# Patient Record
Sex: Female | Born: 1965 | Race: White | Hispanic: No | Marital: Married
Health system: Southern US, Community
[De-identification: ages and names within clinical notes are randomized; demographics above are authoritative.]

## PROBLEM LIST (undated history)

## (undated) DIAGNOSIS — S42301A Unspecified fracture of shaft of humerus, right arm, initial encounter for closed fracture: Secondary | ICD-10-CM

## (undated) DIAGNOSIS — E559 Vitamin D deficiency, unspecified: Secondary | ICD-10-CM

## (undated) DIAGNOSIS — M545 Low back pain, unspecified: Secondary | ICD-10-CM

## (undated) HISTORY — DX: Unspecified fracture of shaft of humerus, right arm, initial encounter for closed fracture: S42.301A

## (undated) HISTORY — PX: CHOLECYSTECTOMY: SHX55

## (undated) HISTORY — DX: Vitamin D deficiency, unspecified: E55.9

## (undated) HISTORY — DX: Low back pain: M54.5

## (undated) HISTORY — PX: OOPHORECTOMY: SHX86

## (undated) HISTORY — DX: Low back pain, unspecified: M54.50

---

## 1999-06-17 ENCOUNTER — Emergency Department (HOSPITAL_COMMUNITY): Admission: EM | Admit: 1999-06-17 | Discharge: 1999-06-17 | Payer: Self-pay | Admitting: *Deleted

## 1999-06-19 ENCOUNTER — Ambulatory Visit (HOSPITAL_COMMUNITY): Admission: RE | Admit: 1999-06-19 | Discharge: 1999-06-19 | Payer: Self-pay | Admitting: Urology

## 1999-06-19 ENCOUNTER — Encounter: Payer: Self-pay | Admitting: Urology

## 1999-11-26 DIAGNOSIS — S42301A Unspecified fracture of shaft of humerus, right arm, initial encounter for closed fracture: Secondary | ICD-10-CM

## 1999-11-26 HISTORY — DX: Unspecified fracture of shaft of humerus, right arm, initial encounter for closed fracture: S42.301A

## 2000-09-08 ENCOUNTER — Encounter: Payer: Self-pay | Admitting: Specialist

## 2000-09-08 ENCOUNTER — Encounter: Admission: RE | Admit: 2000-09-08 | Discharge: 2000-09-08 | Payer: Self-pay | Admitting: Specialist

## 2008-03-03 ENCOUNTER — Emergency Department (HOSPITAL_COMMUNITY): Admission: EM | Admit: 2008-03-03 | Discharge: 2008-03-03 | Payer: Self-pay | Admitting: Emergency Medicine

## 2008-03-03 IMAGING — CR DG CERVICAL SPINE COMPLETE 4+V
5 series · 5 of 5 positions shown · non-contrast
Comparison: None

CLINICAL DATA: Trauma, neck pain

CERVICAL SPINE - COMPLETE 4+ VIEW

[w c-spine lat *]
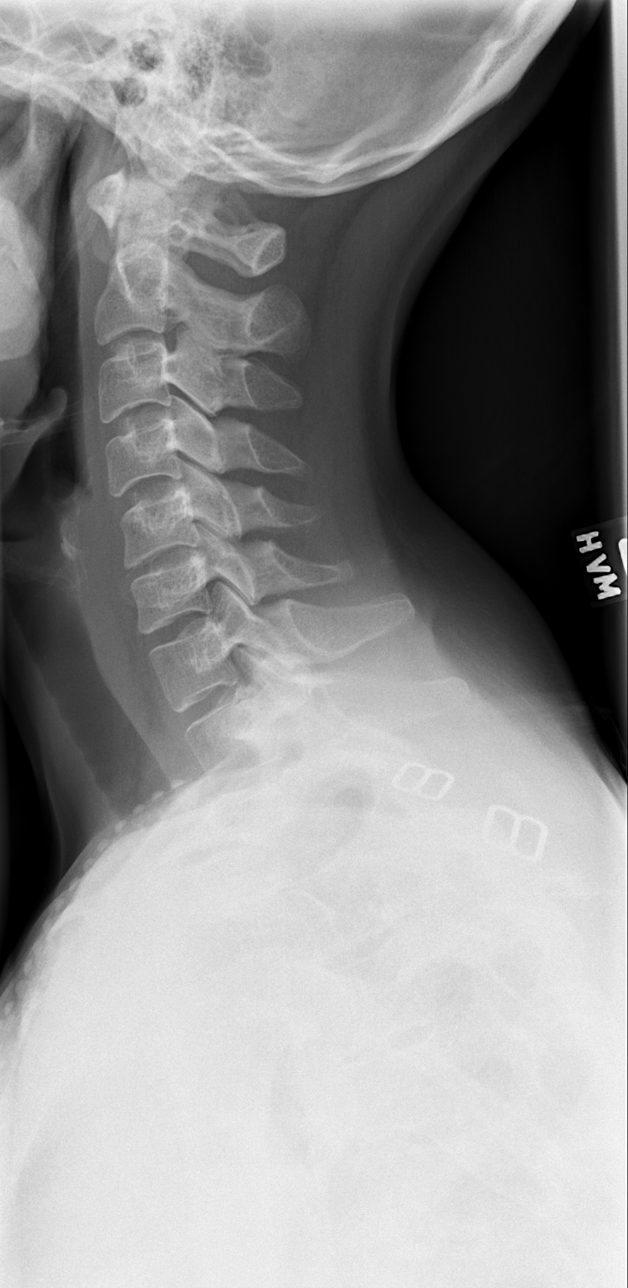

[w c-spine oblique * (1 of 2)]
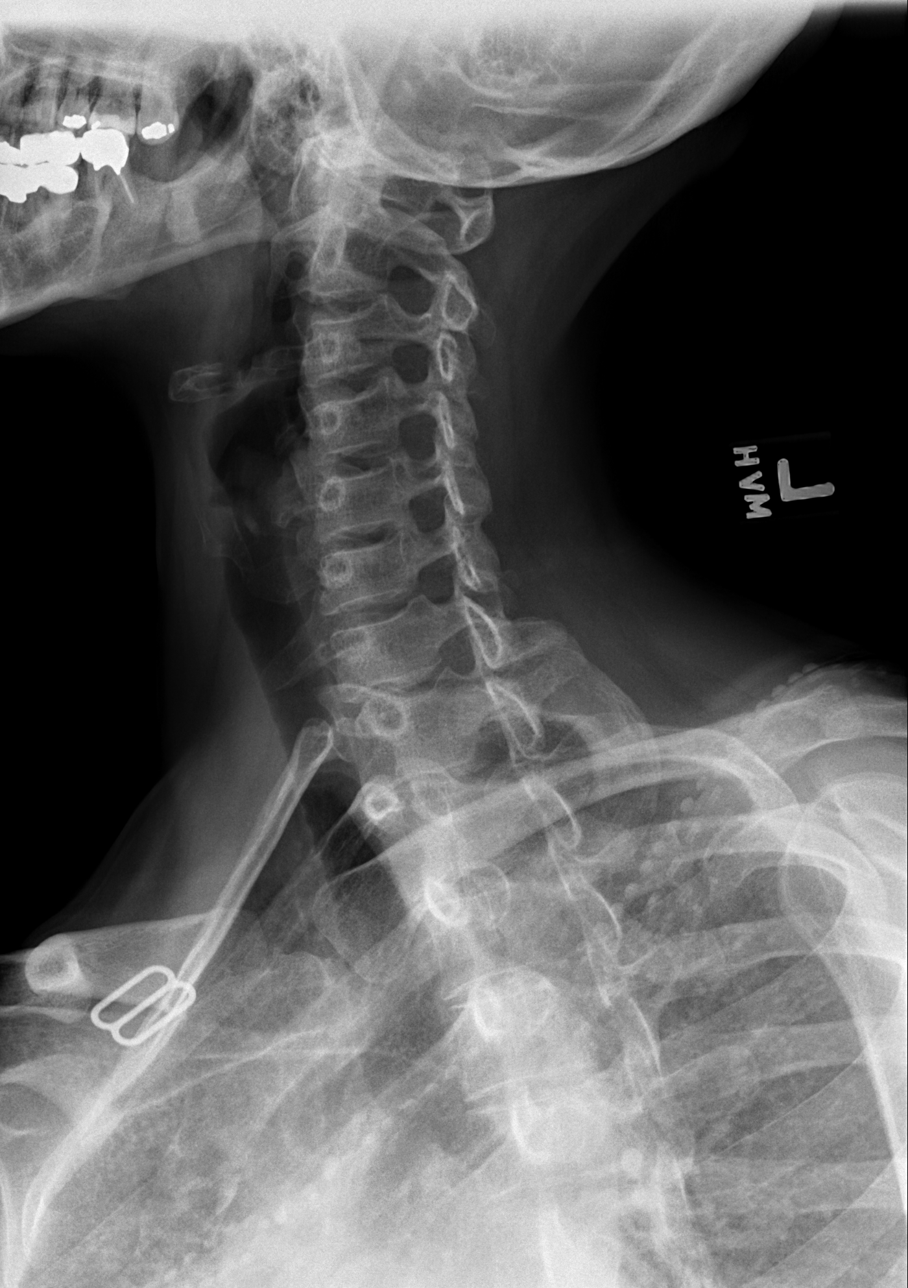

[w c-spine oblique * (2 of 2)]
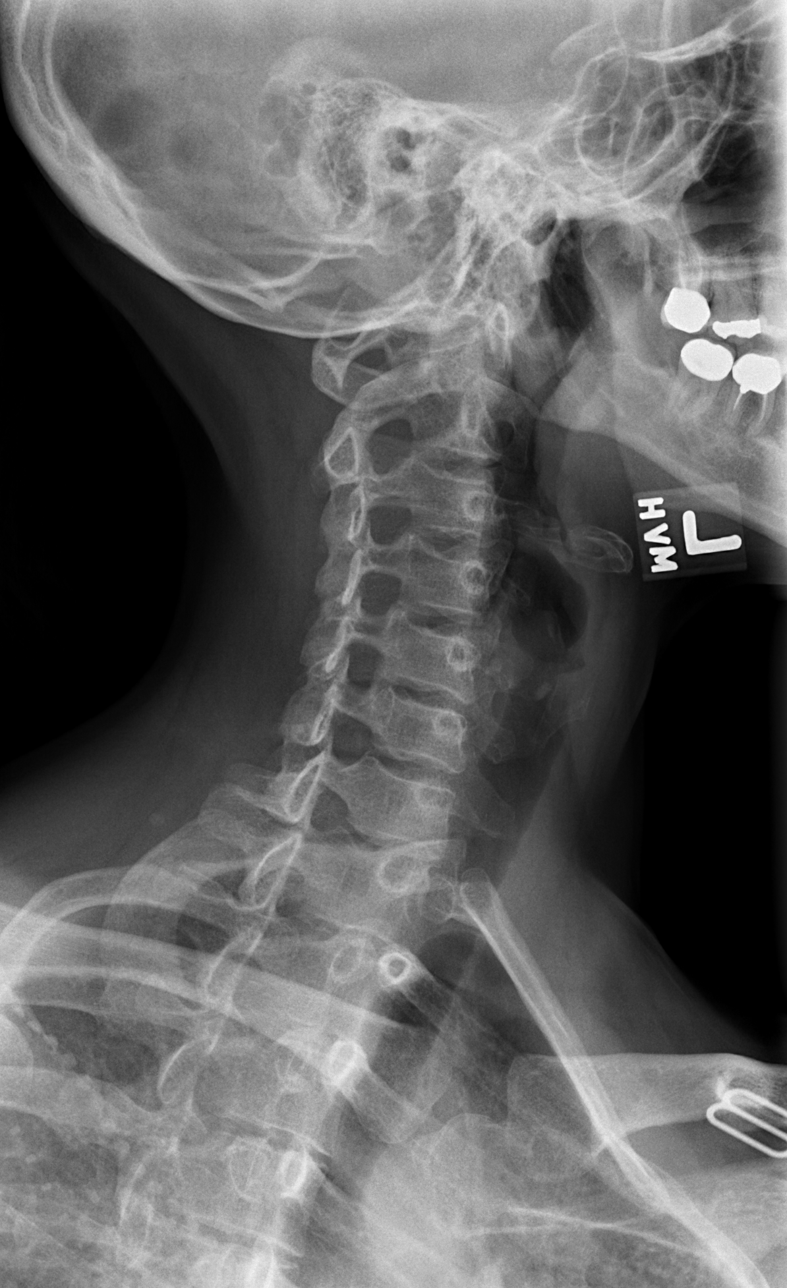

[w c-spine a.p. *]
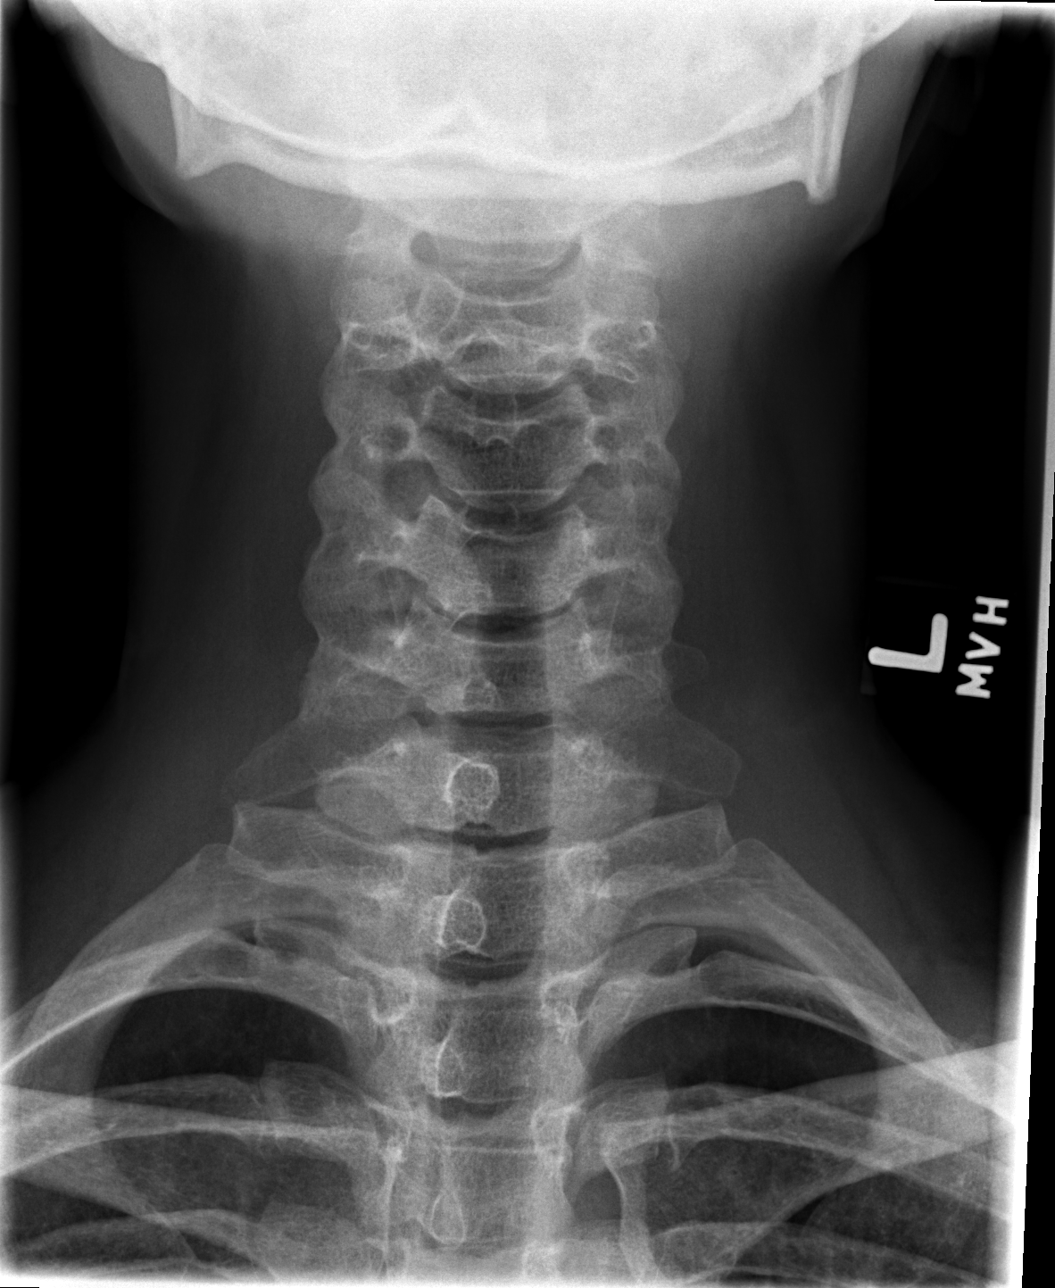

[w c-spine odontoid *]
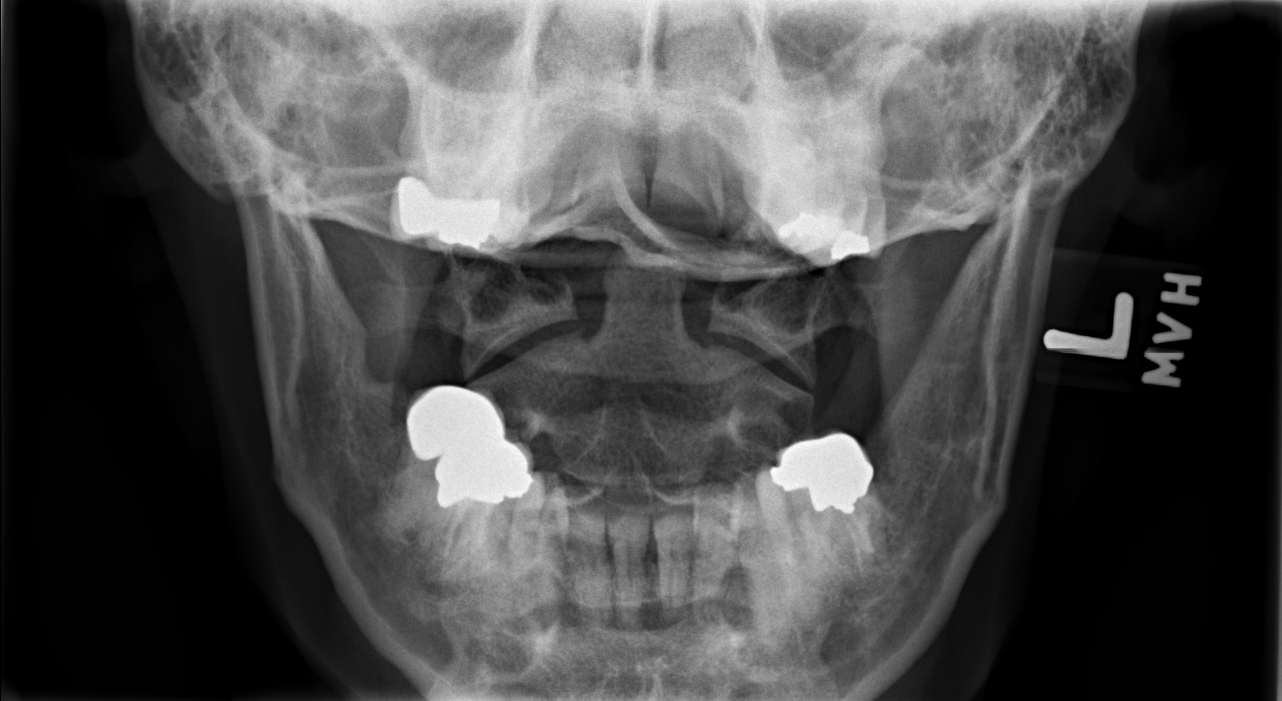

[5 of 5 positions shown; findings below may reference images not displayed]

FINDINGS: The alignment of the cervical spine is anatomic.  The
prevertebral soft tissues are normal.  No fracture is identified.
Cervical thoracic junction is intact
IMPRESSION: Negative for fracture, subluxation or static signs of instability.

## 2016-05-13 ENCOUNTER — Other Ambulatory Visit: Payer: Self-pay | Admitting: Obstetrics and Gynecology

## 2016-05-13 DIAGNOSIS — R928 Other abnormal and inconclusive findings on diagnostic imaging of breast: Secondary | ICD-10-CM

## 2016-05-20 ENCOUNTER — Ambulatory Visit
Admission: RE | Admit: 2016-05-20 | Discharge: 2016-05-20 | Disposition: A | Discharge status: Home / Self Care | Payer: BLUE CROSS/BLUE SHIELD | Source: Ambulatory Visit | Attending: Obstetrics and Gynecology | Admitting: Obstetrics and Gynecology

## 2016-05-20 DIAGNOSIS — R928 Other abnormal and inconclusive findings on diagnostic imaging of breast: Secondary | ICD-10-CM

## 2018-05-18 ENCOUNTER — Ambulatory Visit: Payer: BLUE CROSS/BLUE SHIELD | Admitting: Diagnostic Neuroimaging

## 2018-05-18 ENCOUNTER — Encounter: Payer: Self-pay | Admitting: Diagnostic Neuroimaging

## 2018-05-18 ENCOUNTER — Encounter: Payer: Self-pay | Admitting: *Deleted

## 2018-05-18 ENCOUNTER — Telehealth: Payer: Self-pay | Admitting: *Deleted

## 2018-05-18 NOTE — Telephone Encounter (Signed)
Patient called this morning and cancelled her new patient appointment for 10 am today due to tooth abscess.

## 2018-07-16 ENCOUNTER — Telehealth: Payer: Self-pay | Admitting: *Deleted

## 2018-07-16 NOTE — Telephone Encounter (Signed)
LMVM for pt on her mobile, about her appt scheduled for tomorrow.  Looking at it referral, LBP and spinal injections?  Asked her to call relating to questions concerning her appt.  Her work [employer]   Pt returned call and stated due to her traveling relating to her grandbaby (has cancer (retinoblastoma) in [place] she has to cancel tomorrow's appt and then reschedule sometime in sept or oct.  She will call to do this.  She is aware that it is consult and not for us to give injections.  She wants to find out what is causing back pain and hip pain.

## 2018-07-17 ENCOUNTER — Ambulatory Visit: Payer: BLUE CROSS/BLUE SHIELD | Admitting: Diagnostic Neuroimaging

## 2019-04-20 ENCOUNTER — Other Ambulatory Visit: Payer: Self-pay | Admitting: Rehabilitation

## 2019-04-20 DIAGNOSIS — M5416 Radiculopathy, lumbar region: Secondary | ICD-10-CM

## 2019-04-21 ENCOUNTER — Ambulatory Visit
Admission: RE | Admit: 2019-04-21 | Discharge: 2019-04-21 | Disposition: A | Discharge status: Home / Self Care | Payer: BLUE CROSS/BLUE SHIELD | Source: Ambulatory Visit | Attending: Rehabilitation | Admitting: Rehabilitation

## 2019-04-21 ENCOUNTER — Other Ambulatory Visit: Payer: Self-pay

## 2019-04-21 DIAGNOSIS — M5416 Radiculopathy, lumbar region: Secondary | ICD-10-CM

## 2019-04-21 IMAGING — MR MRI LUMBAR SPINE WITHOUT CONTRAST
4 of 5 series · 26 of 48 positions shown · non-contrast
Comparison: MRI lumbar spine dated [DATE].

CLINICAL DATA: Chronic right-sided low back pain radiating into the
right leg.

EXAM:
MRI LUMBAR SPINE WITHOUT CONTRAST
TECHNIQUE: Multiplanar, multisequence MR imaging of the lumbar spine was
performed. No intravenous contrast was administered.

[Series 3: T2 · sagittal · 4.0mm · 0.55mm/px · 6 of 14 slices shown (1 of 2)]
[im 1/14]
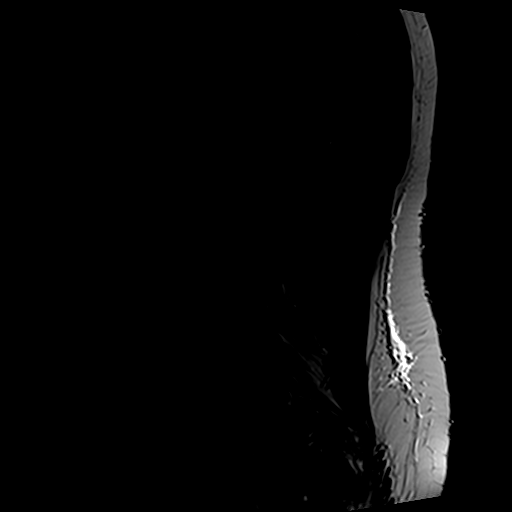
[im 3/14]
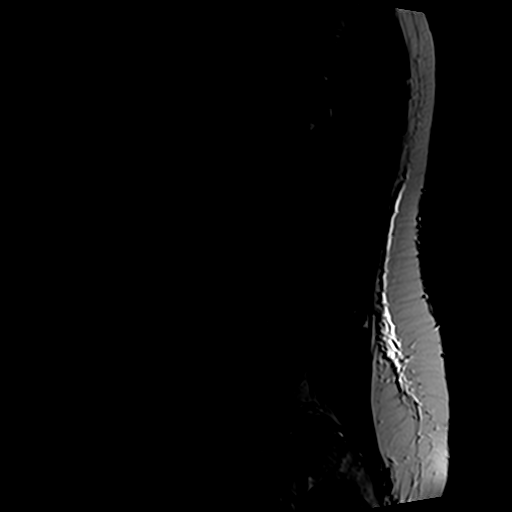
[im 6/14]
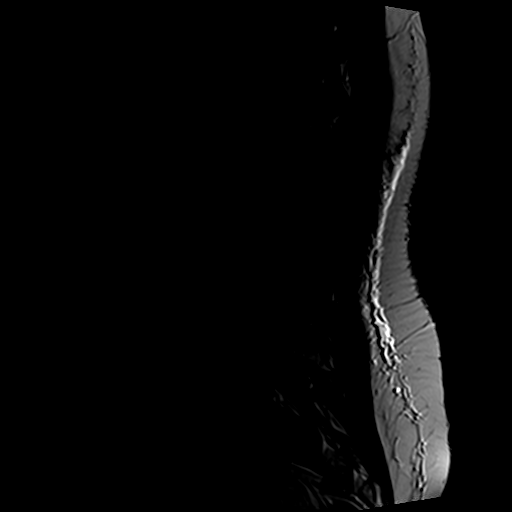
[im 8/14]
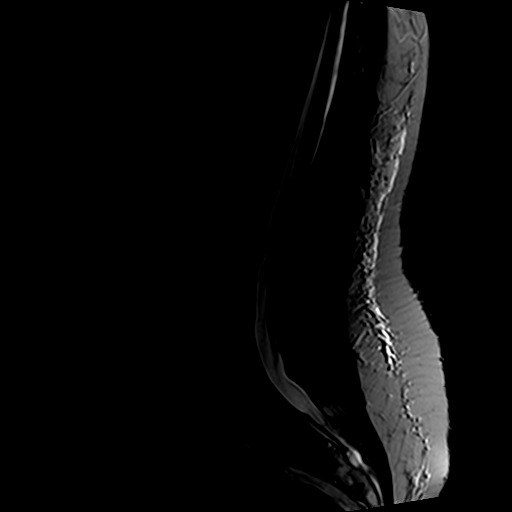
[im 11/14]
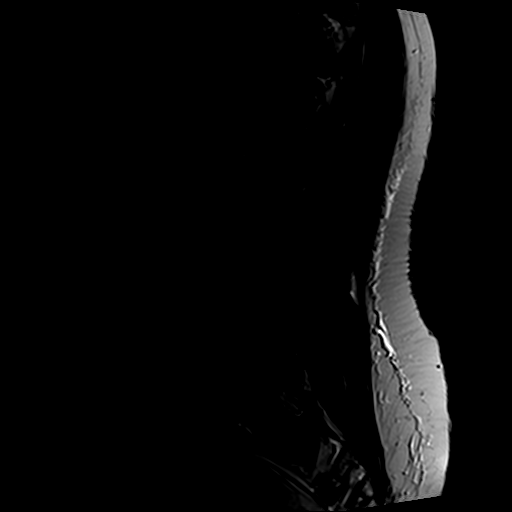
[im 14/14]
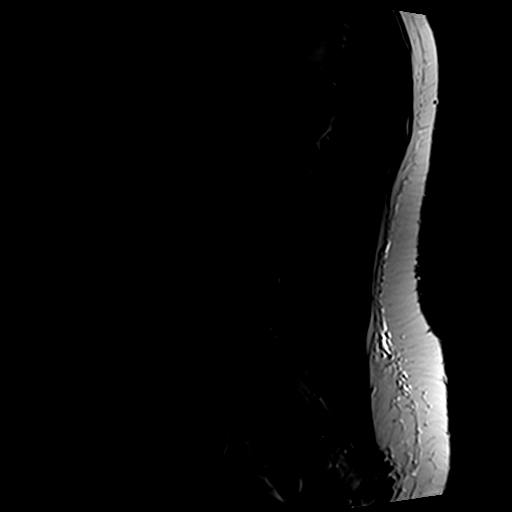

[Series 4: T1 · sagittal · 4.0mm · 0.55mm/px · 6 of 14 slices shown (1 of 2)]
[im 1/14]
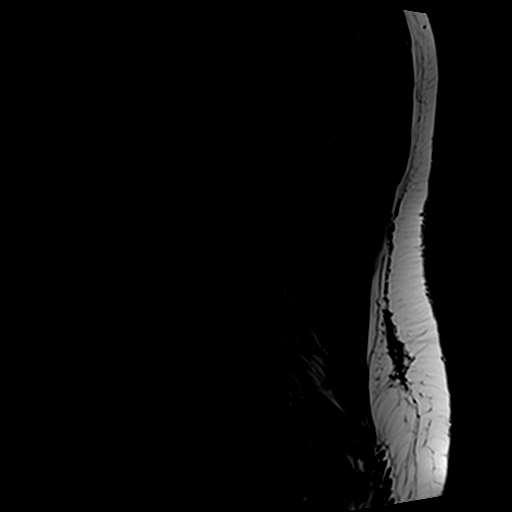
[im 3/14]
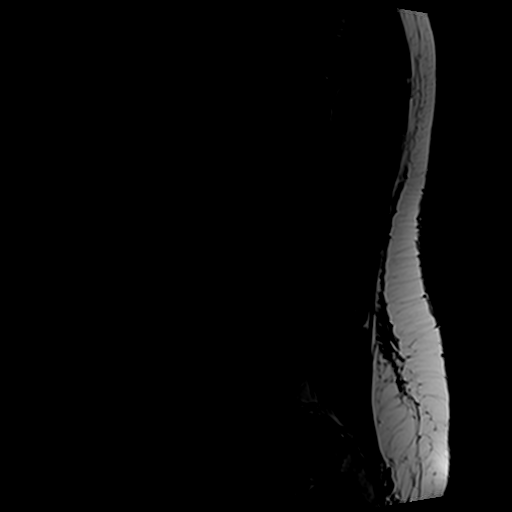
[im 6/14]
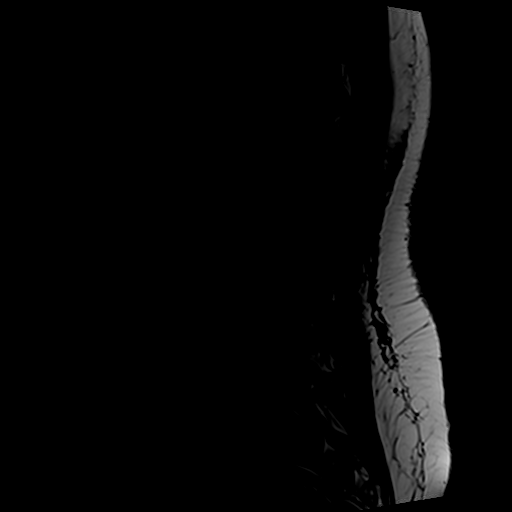
[im 8/14]
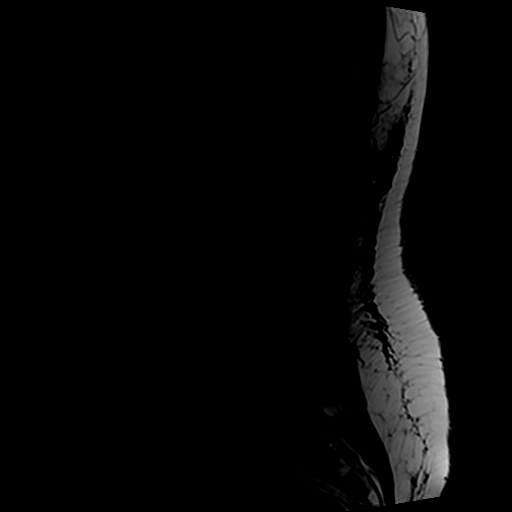
[im 11/14]
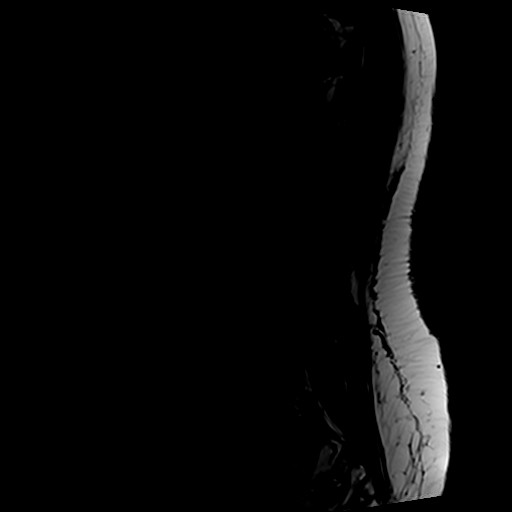
[im 14/14]
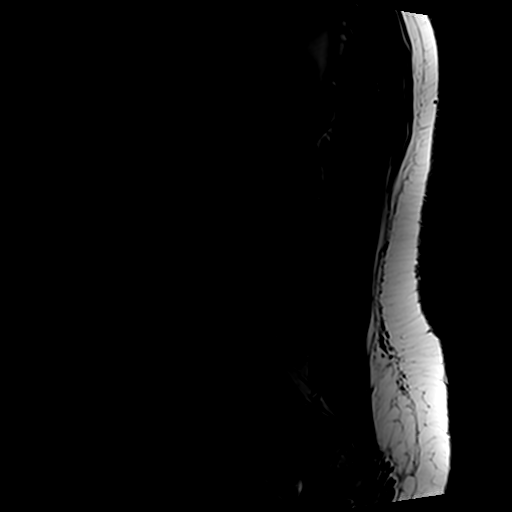

[Series 6: T2 · axial · 4.0mm · 0.70mm/px · z∈[-93,+109]mm · 9 of 36 slices shown (2 of 2)]
[im 1/36]
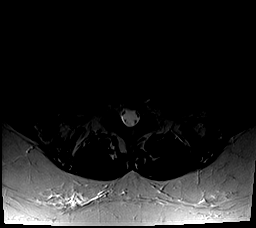
[im 6/36]
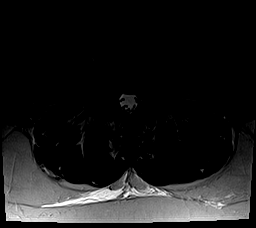
[im 11/36]
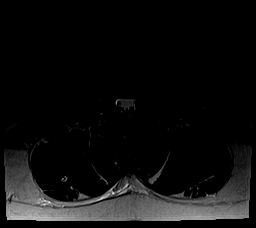
[im 16/36]
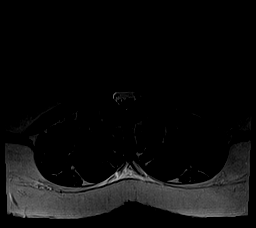
[im 18/36]
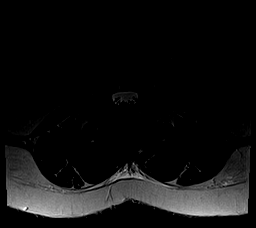
[im 21/36]
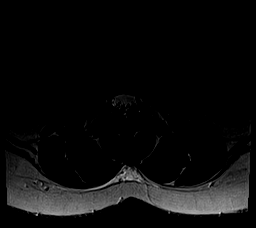
[im 26/36]
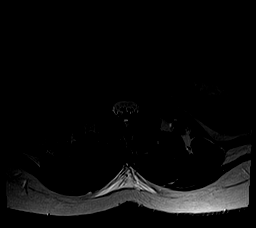
[im 31/36]
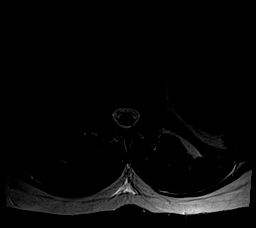
[im 36/36]
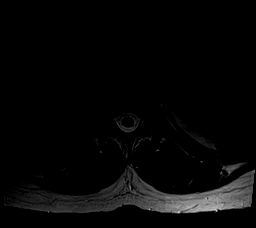

[Series 7: T1 · axial · 4.0mm · 0.35mm/px · z∈[-93,+84]mm · 5 of 36 slices shown (2 of 2)]
[im 1/36]
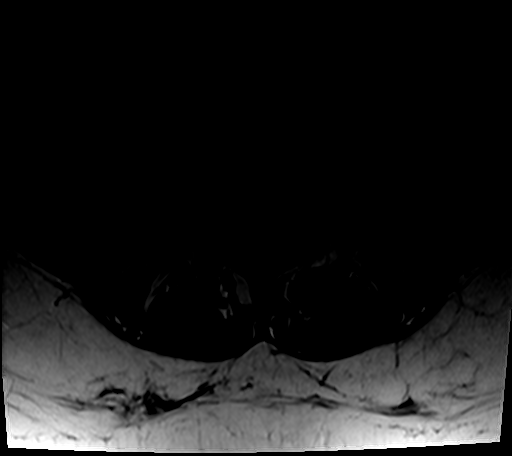
[im 6/36]
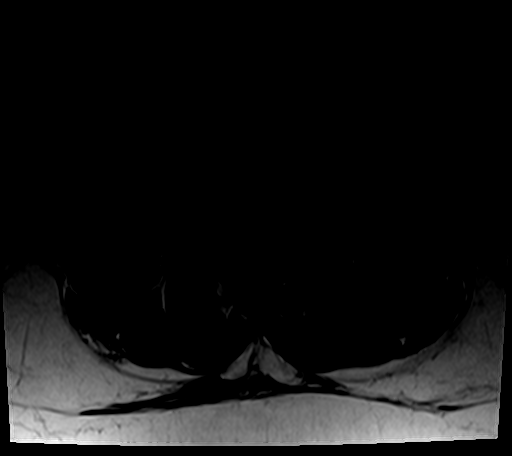
[im 11/36]
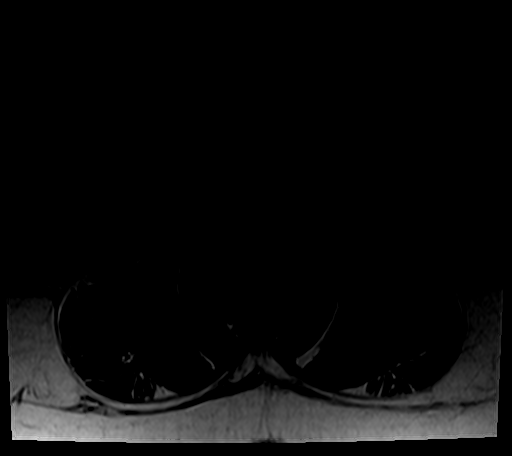
[im 18/36]
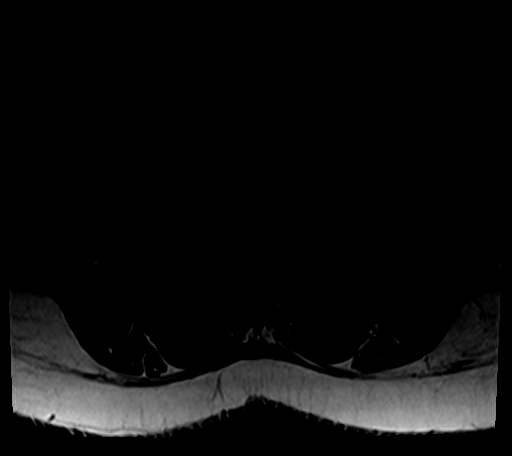
[im 31/36]
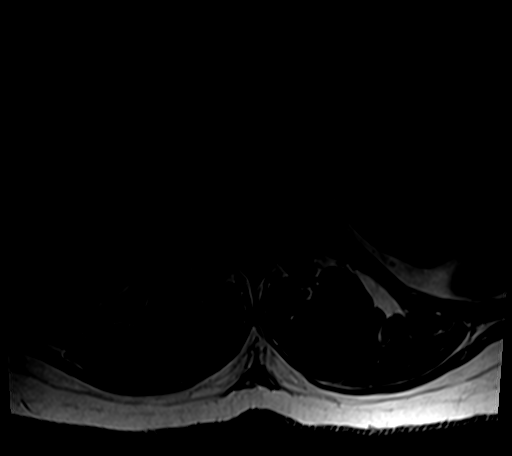

[26 of 48 positions shown; findings below may reference images not displayed]

FINDINGS: Segmentation: Unchanged partial lumbarization of S1 with rudimentary
S1-S2 disc space.

Alignment:  Physiologic.

Vertebrae:  No fracture, evidence of discitis, or bone lesion.

Conus medullaris and cauda equina: Conus extends to the L2 level.
Conus and cauda equina appear normal.

Paraspinal and other soft tissues: Negative.

Disc levels:

T12-L1:  Negative.

L1-L2:  Negative.

L2-L3:  Negative.

L3-L4: Unchanged mild disc bulging and small left far lateral disc
protrusion encroaching on the exiting left L3 nerve root. No
stenosis.

L4-L5: Unchanged minimal disc bulging encroaching on the
extraforaminal left L4 nerve root. No stenosis.

L5-S1: Unchanged tiny shallow broad-based posterior disc protrusion.
No stenosis.
IMPRESSION: 1. Unchanged mild lower lumbar spondylosis as described above, with
disc material encroaching on the exiting left L3 and L4 nerve roots.
No stenosis or impingement.

## 2020-04-12 ENCOUNTER — Other Ambulatory Visit: Payer: Self-pay | Admitting: Rehabilitation

## 2020-04-12 DIAGNOSIS — M47816 Spondylosis without myelopathy or radiculopathy, lumbar region: Secondary | ICD-10-CM

## 2020-06-06 ENCOUNTER — Ambulatory Visit
Admission: RE | Admit: 2020-06-06 | Discharge: 2020-06-06 | Disposition: A | Discharge status: Home / Self Care | Payer: BLUE CROSS/BLUE SHIELD | Source: Ambulatory Visit | Attending: Rehabilitation | Admitting: Rehabilitation

## 2020-06-06 ENCOUNTER — Other Ambulatory Visit: Payer: Self-pay

## 2020-06-06 DIAGNOSIS — M47816 Spondylosis without myelopathy or radiculopathy, lumbar region: Secondary | ICD-10-CM

## 2020-06-06 IMAGING — MR MR LUMBAR SPINE W/O CM
4 of 5 series · 18 of 48 positions shown · non-contrast
Comparison: [DATE] MRI lumbar spine.

CLINICAL DATA: Low back pain.

EXAM:
MRI LUMBAR SPINE WITHOUT CONTRAST
TECHNIQUE: Multiplanar, multisequence MR imaging of the lumbar spine was
performed. No intravenous contrast was administered.

[Series 5: T2 · sagittal · 4.0mm · 0.73mm/px · 6 of 15 slices shown (1 of 2)]
[im 1/15]
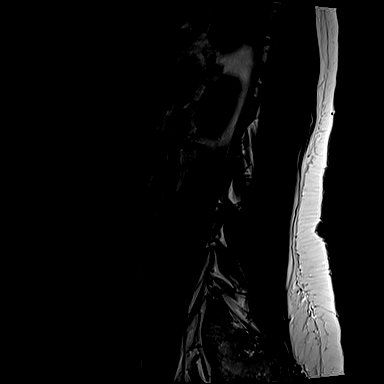
[im 3/15]
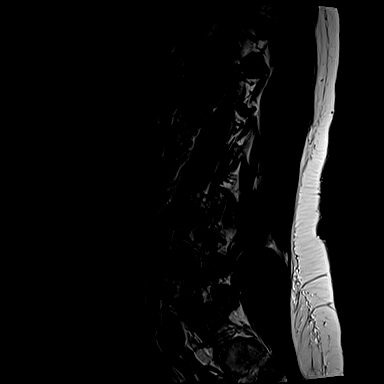
[im 6/15]
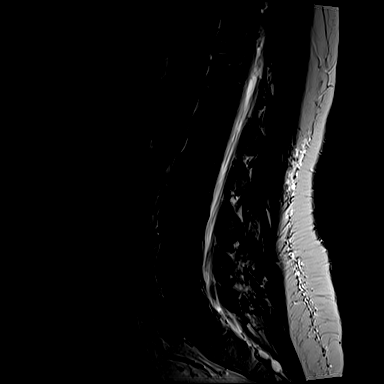
[im 9/15]
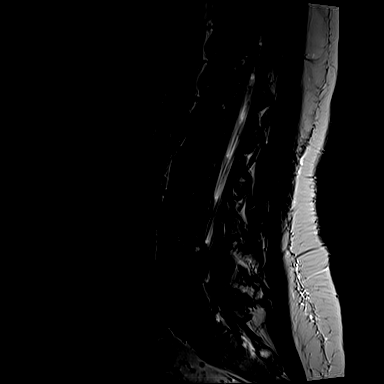
[im 12/15]
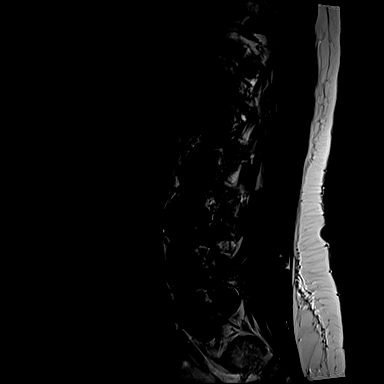
[im 15/15]
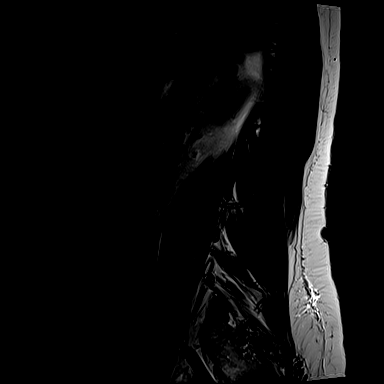

[Series 6: T1 · sagittal · 4.0mm · 0.73mm/px · 3 of 15 slices shown (1 of 2)]
[im 3/15]
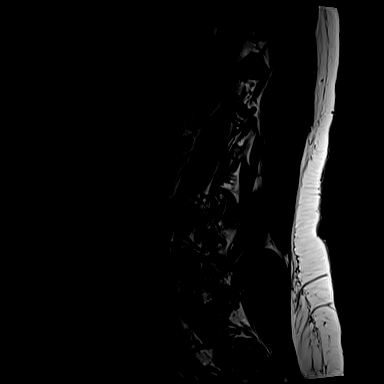
[im 9/15]
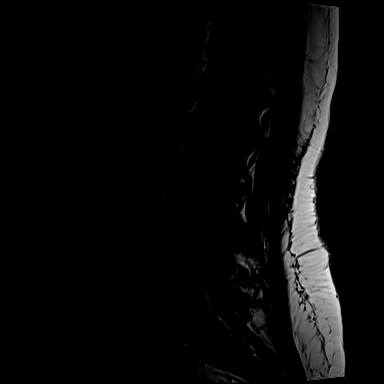
[im 15/15]
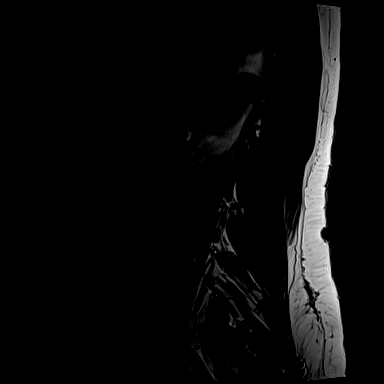

[Series 10: T1 · axial · 4.0mm · 0.28mm/px · z∈[+54,+214]mm · 3 of 40 slices shown (2 of 2)]
[im 6/40]
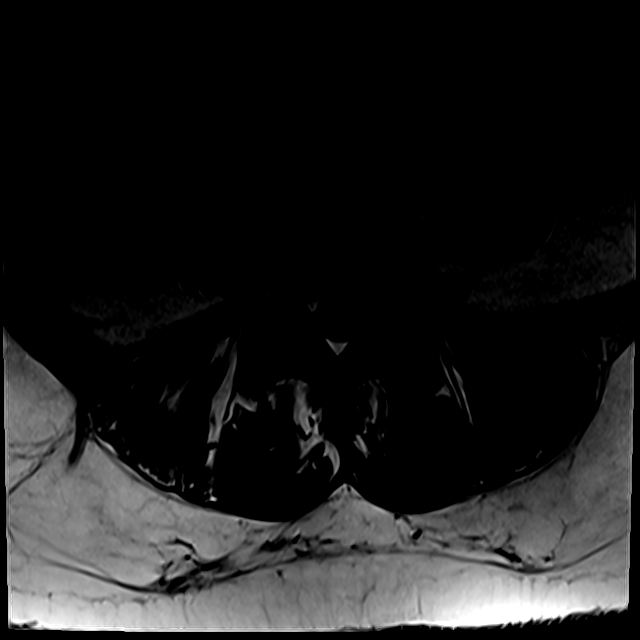
[im 20/40]
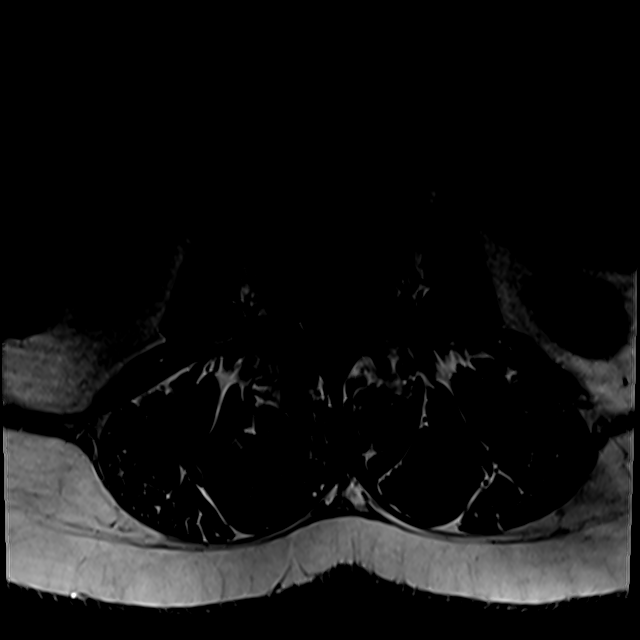
[im 34/40]
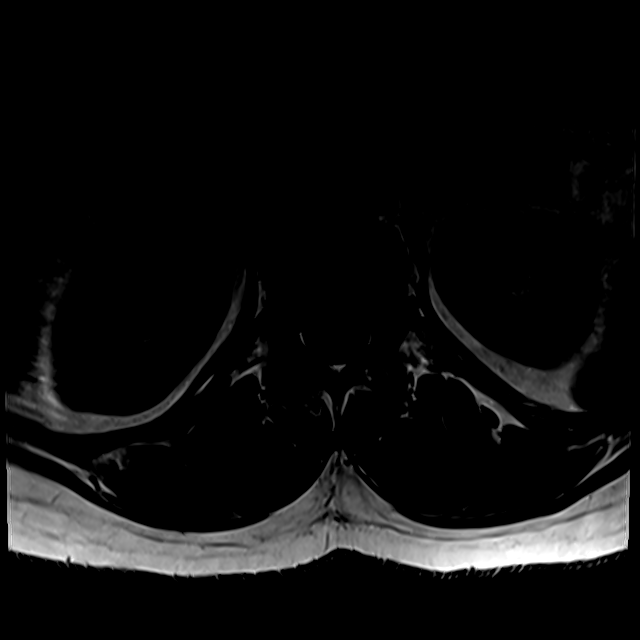

[Series 13: T2 · axial · 4.0mm · 0.28mm/px · z∈[+29,+214]mm · 6 of 40 slices shown (2 of 2)]
[im 1/40]
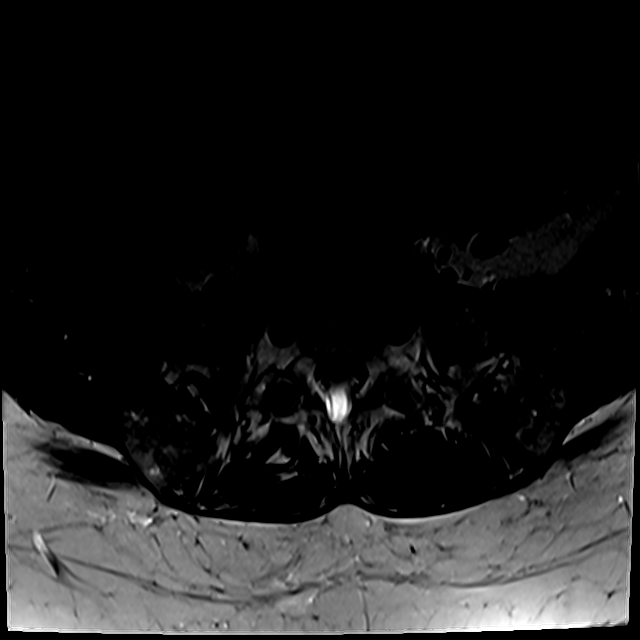
[im 6/40]
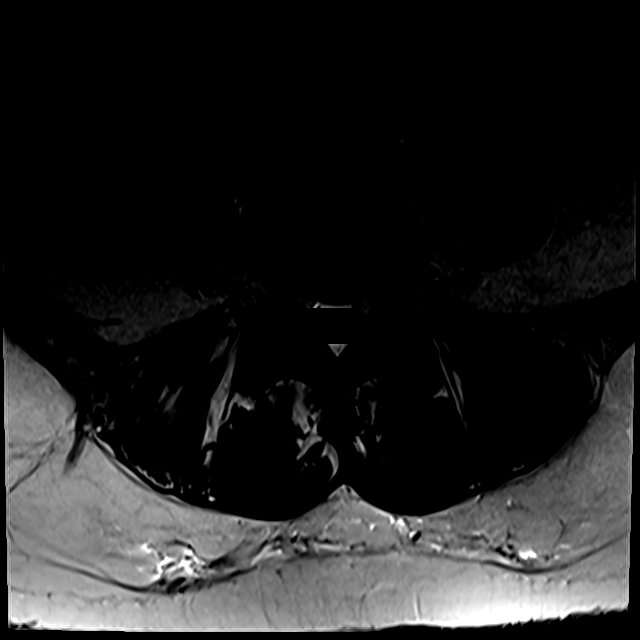
[im 12/40]
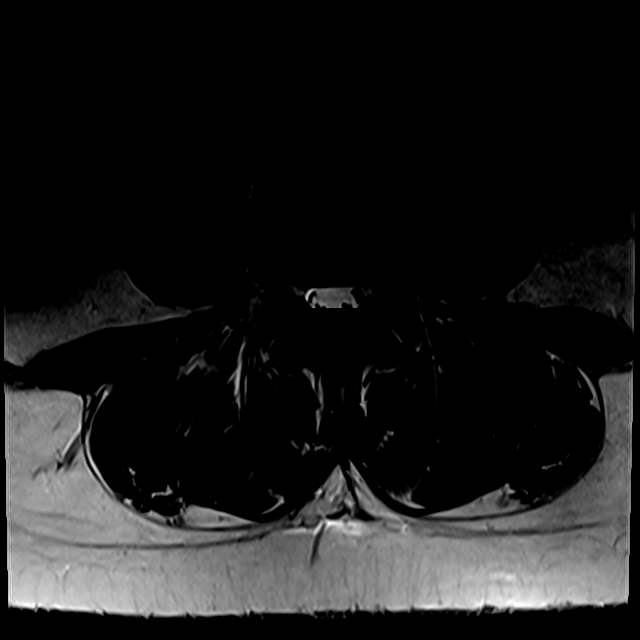
[im 17/40]
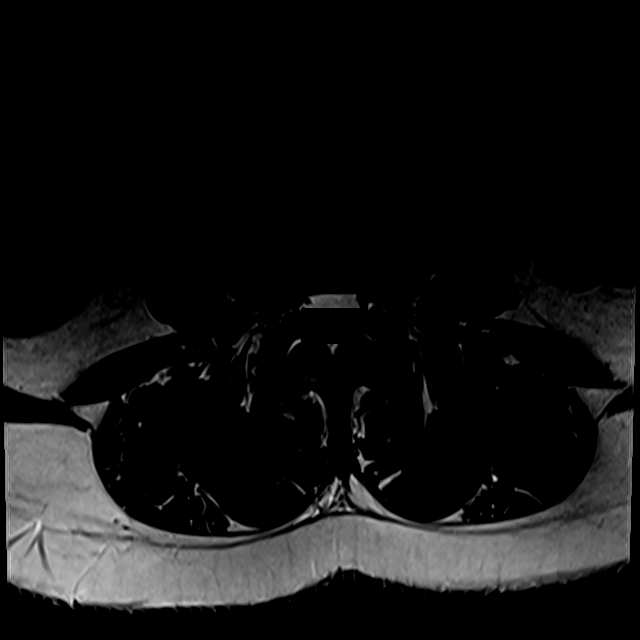
[im 20/40]
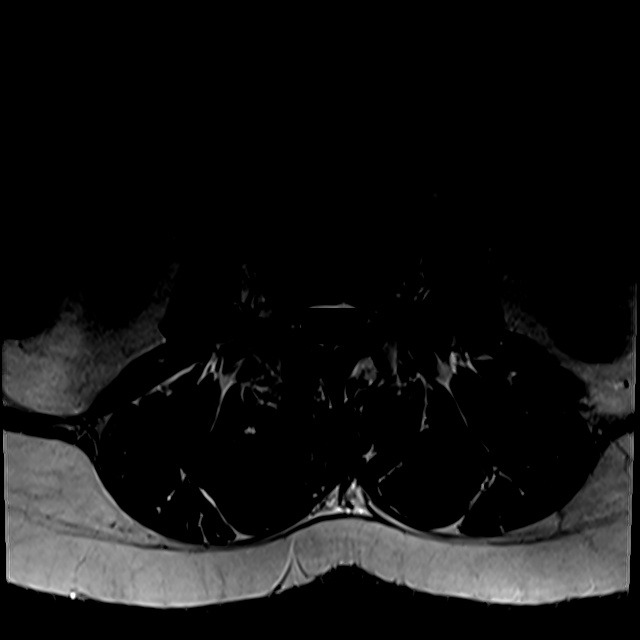
[im 34/40]
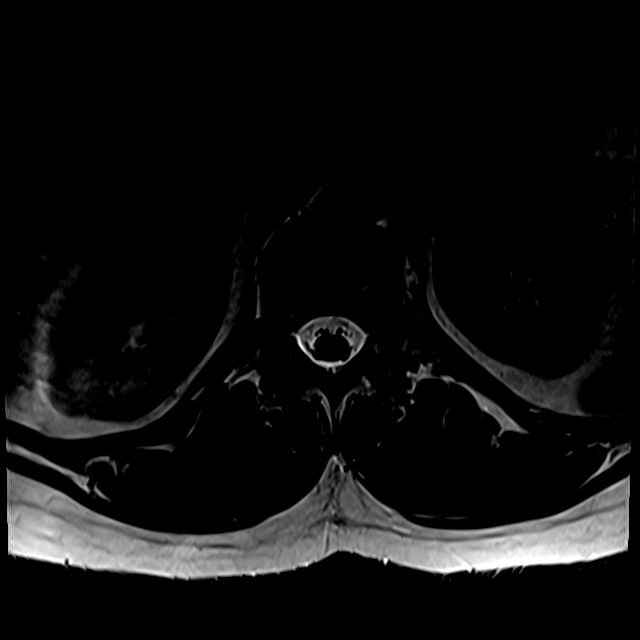

[18 of 48 positions shown; findings below may reference images not displayed]

FINDINGS: Segmentation: Transitional lumbosacral anatomy with S1
lumbarization.

Alignment:  Normal.

Vertebrae: Mild bone marrow edema involving the right L3-4 facet
joint. No fracture. Otherwise normal bone marrow signal intensity.

Conus medullaris and cauda equina: Conus extends to the L2 level.
Conus and cauda equina appear normal.

Disc levels: Multilevel desiccation.  Mild L3-4 disc space loss.

L1-2: No significant disc bulge, spinal canal or neural foraminal
narrowing.

L2-3: No significant disc bulge, spinal canal or neural foraminal
narrowing.

L3-4: Mild disc bulge with superimposed left
foraminal/extraforaminal protrusion and bilateral facet degenerative
spurring. Mild left greater than right neural foraminal narrowing.

L4-5: Mild disc bulge with prominent ligamentum flavum and bilateral
facet degenerative spurring. Mild bilateral neural foraminal
narrowing.

L5-S1: Disc bulge, abutting the traversing bilateral L5 nerve roots,
ligamentum flavum and bilateral facet hypertrophy. Mild bilateral
neural foraminal narrowing.

Paraspinal and other soft tissues: Bilateral parapelvic cysts. Mild
paraspinal soft tissue edema surrounding the right L3-4 facet joint.
IMPRESSION: Multilevel spondylosis with mild bilateral neural foraminal
narrowing at the L3-S1 levels.

No significant spinal canal narrowing.

Mild right L3-4 facet joint edema and periarticular inflammation,
likely degenerative.

S1 lumbarization.

## 2020-12-15 ENCOUNTER — Other Ambulatory Visit: Payer: Self-pay | Admitting: Neurosurgery

## 2020-12-15 DIAGNOSIS — M545 Low back pain, unspecified: Secondary | ICD-10-CM

## 2020-12-30 ENCOUNTER — Ambulatory Visit
Admission: RE | Admit: 2020-12-30 | Discharge: 2020-12-30 | Disposition: A | Discharge status: Home / Self Care | Payer: BC Managed Care – PPO | Source: Ambulatory Visit | Attending: Neurosurgery | Admitting: Neurosurgery

## 2020-12-30 ENCOUNTER — Other Ambulatory Visit: Payer: Self-pay

## 2020-12-30 DIAGNOSIS — M545 Low back pain, unspecified: Secondary | ICD-10-CM

## 2020-12-30 IMAGING — MR MR LUMBAR SPINE W/O CM
4 of 5 series · 27 of 48 positions shown · non-contrast
Comparison: [DATE].

CLINICAL DATA: Mid lower back pain.

EXAM:
MRI LUMBAR SPINE WITHOUT CONTRAST
TECHNIQUE: Multiplanar, multisequence MR imaging of the lumbar spine was
performed. No intravenous contrast was administered.

[Series 3: T2 · sagittal · 4.0mm · 1.09mm/px · 6 of 17 slices shown (1 of 2)]
[im 1/17]
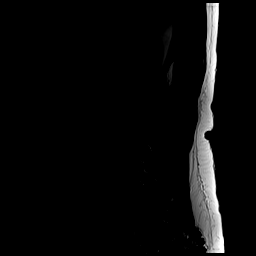
[im 4/17]
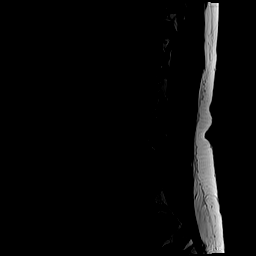
[im 7/17]
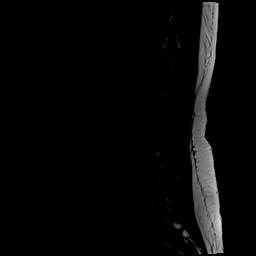
[im 10/17]
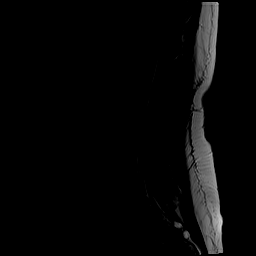
[im 13/17]
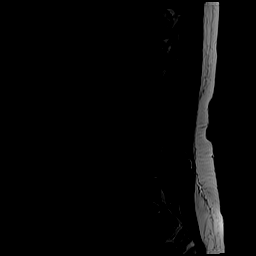
[im 17/17]
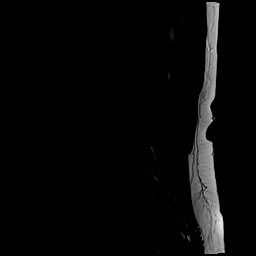

[Series 5: T1 · sagittal · 4.0mm · 1.09mm/px · 6 of 17 slices shown (1 of 2)]
[im 1/17]
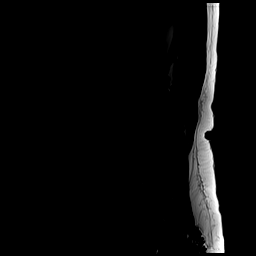
[im 4/17]
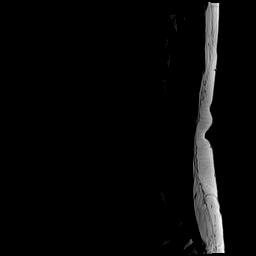
[im 7/17]
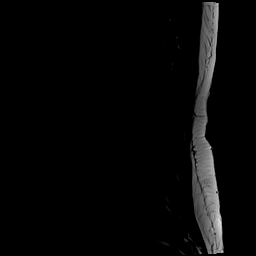
[im 10/17]
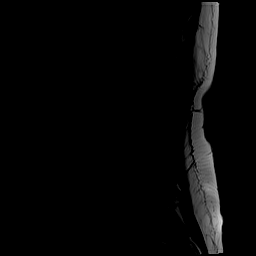
[im 13/17]
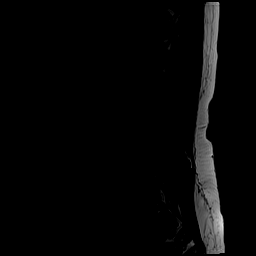
[im 17/17]
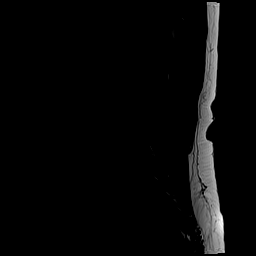

[Series 6: T2 · axial · 4.0mm · 0.39mm/px · z∈[-20,+198]mm · 9 of 42 slices shown (2 of 2)]
[im 1/42]
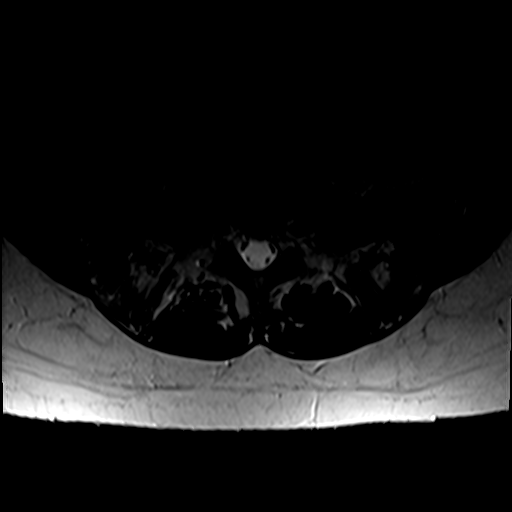
[im 6/42]
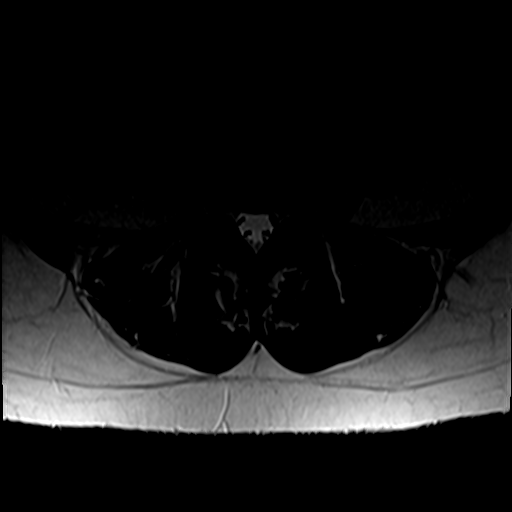
[im 12/42]
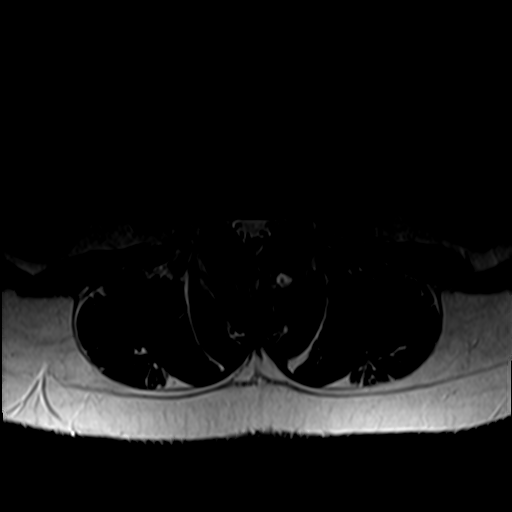
[im 18/42]
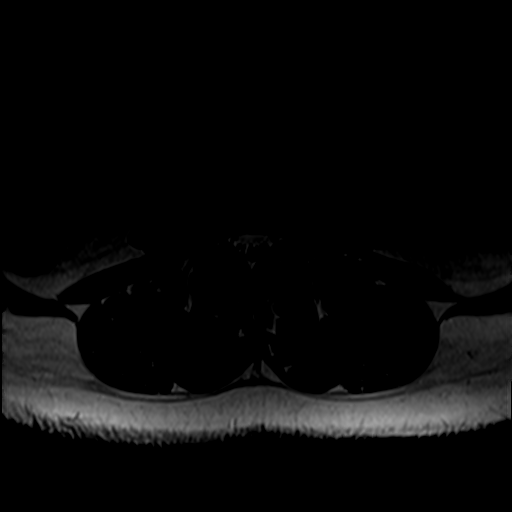
[im 21/42]
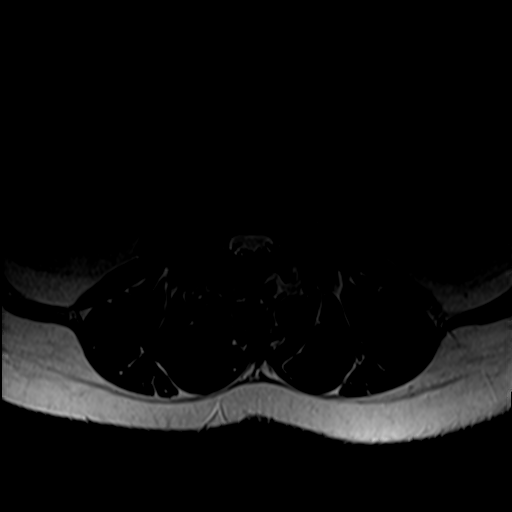
[im 24/42]
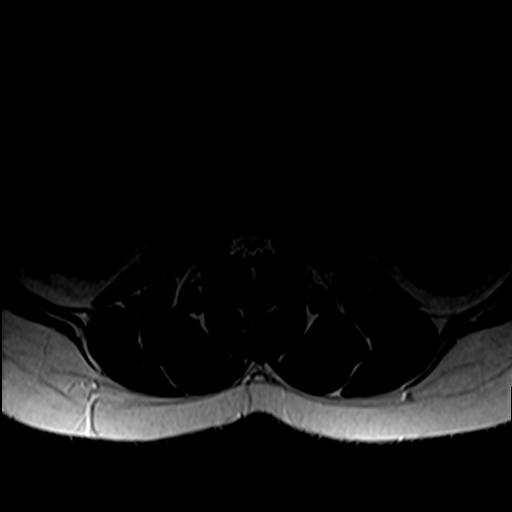
[im 30/42]
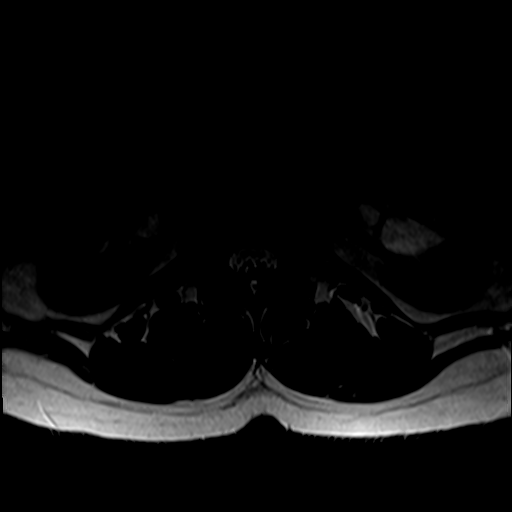
[im 36/42]
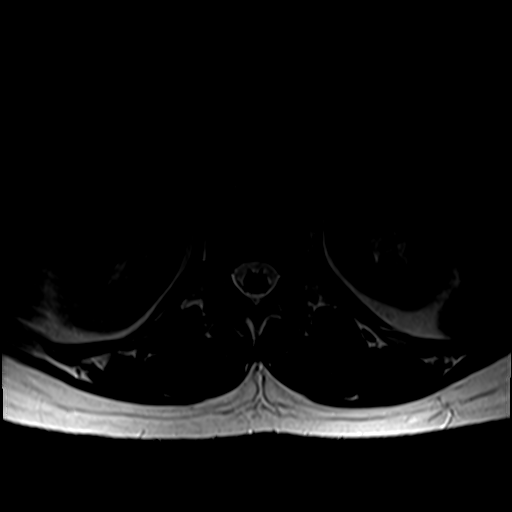
[im 42/42]
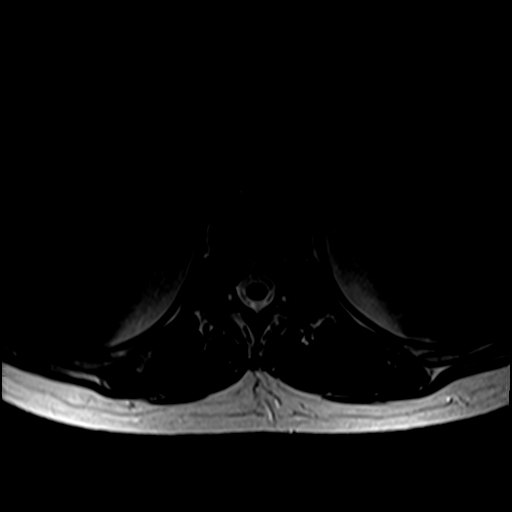

[Series 7: T1 · axial · 4.0mm · 0.39mm/px · z∈[-20,+169]mm · 6 of 42 slices shown (2 of 2)]
[im 1/42]
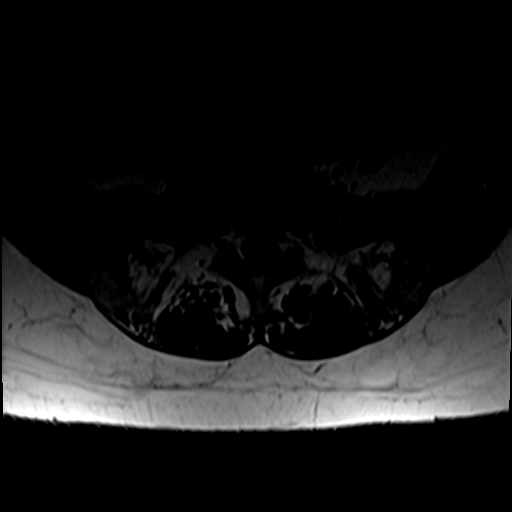
[im 6/42]
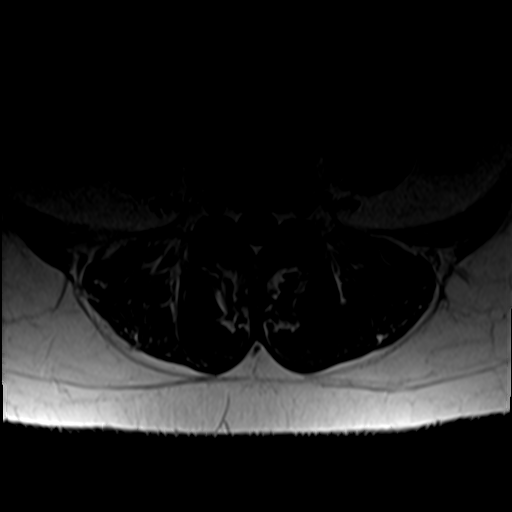
[im 12/42]
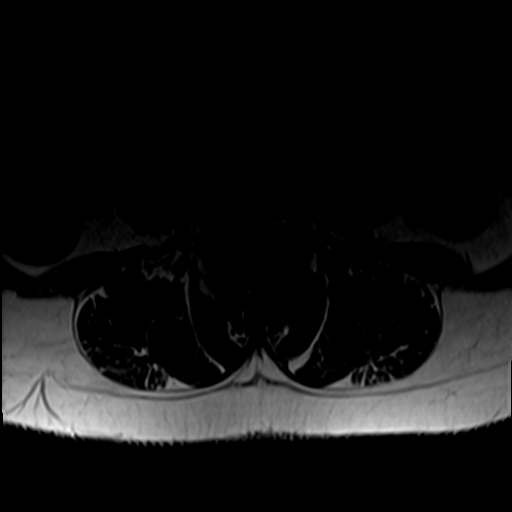
[im 18/42]
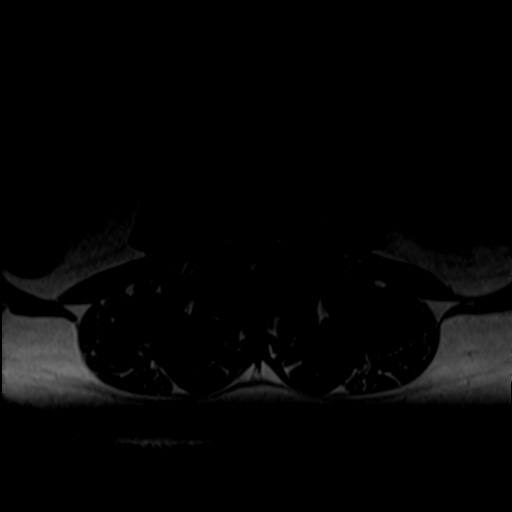
[im 21/42]
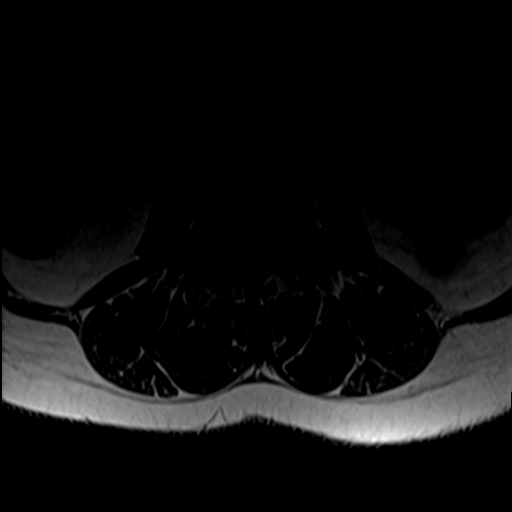
[im 36/42]
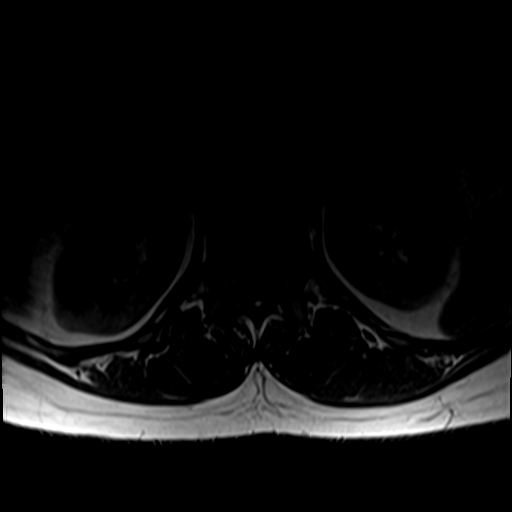

[27 of 48 positions shown; findings below may reference images not displayed]

FINDINGS: Segmentation: Transitional lumbosacral anatomy with lumbarization of
S1. This numbering scheme is consistent with the prior MRI.

Alignment:  Normal.

Vertebrae: Vertebral body heights are maintained. Similar bone
marrow edema involving the right L3-L4 facet joint, likely
degenerative. No specific evidence of acute fracture, traumatic
malalignment, or suspicious bone lesion. L4 vertebral venous
malformation (hemangioma).

Conus medullaris and cauda equina: Conus extends to the L2 level.
Conus and cauda equina appear normal.

Paraspinal and other soft tissues: Bilateral parapelvic renal cysts.
Edema involving the right L3-L4 facet joint is similar.
New/increased edema about the left L3-L4 facet joint.

Disc levels:

T12-L1: No significant disc protrusion, foraminal stenosis, or canal
stenosis.

L1-L2: No significant disc protrusion, foraminal stenosis, or canal
stenosis.

L2-L3: No significant disc protrusion, foraminal stenosis, or canal
stenosis.

L3-L4: Similar mild disc bulge with superimposed left
foraminal/extraforaminal disc protrusion. Mild bilateral facet
hypertrophy. Similar resulting mild bilateral foraminal stenosis. No
significant canal stenosis.

L4-L5: Similar broad-based disc bulge with ligamentum flavum
thickening and bilateral facet hypertrophy. New 7 mm extra-spinal
posterior left facet joint synovial cyst. Similar mild bilateral
foraminal stenosis.

L5-S1: Similar disc bulge, ligamentum flavum thickening, and
bilateral facet hypertrophy. Similar mild bilateral foraminal
stenosis. No significant canal stenosis.
IMPRESSION: Transitional lumbosacral anatomy with lumbarization of S1.

1. Similar multilevel mild neural foraminal narrowing. No
significant canal stenosis.
2. Similar right L3-L4 facet joint bone marrow edema with slightly
increased (now bilateral) perifacet inflammation and new small left
dorsal extra-spinal facet joint synovial cyst. Findings are most
likely degenerative in etiology.

## 2024-12-14 LAB — LAB REPORT - SCANNED
A1c: 5.4
EGFR: 85
HM Hepatitis Screen: NEGATIVE
